# Patient Record
Sex: Female | Born: 1970 | Hispanic: Yes | Marital: Single | Smoking: Never smoker
Health system: Southern US, Community
[De-identification: ages and names within clinical notes are randomized; demographics above are authoritative.]

---

## 2008-02-24 ENCOUNTER — Ambulatory Visit: Payer: Self-pay | Admitting: Unknown Physician Specialty

## 2008-03-16 ENCOUNTER — Ambulatory Visit: Payer: Self-pay | Admitting: Unknown Physician Specialty

## 2010-05-06 HISTORY — PX: BREAST CYST ASPIRATION: SHX578

## 2011-09-11 ENCOUNTER — Ambulatory Visit: Payer: Self-pay | Admitting: General Practice

## 2013-12-30 ENCOUNTER — Ambulatory Visit: Payer: Self-pay | Admitting: Obstetrics & Gynecology

## 2014-01-11 ENCOUNTER — Ambulatory Visit: Payer: Self-pay | Admitting: Obstetrics & Gynecology

## 2015-02-10 ENCOUNTER — Ambulatory Visit: Payer: Self-pay | Admitting: Family

## 2015-02-10 ENCOUNTER — Encounter: Payer: Self-pay | Admitting: Family

## 2015-02-10 VITALS — BP 110/70 | HR 83 | Temp 98.0°F

## 2015-02-10 DIAGNOSIS — J302 Other seasonal allergic rhinitis: Secondary | ICD-10-CM

## 2015-02-10 DIAGNOSIS — Z299 Encounter for prophylactic measures, unspecified: Secondary | ICD-10-CM

## 2015-02-10 MED ORDER — FLUTICASONE PROPIONATE 50 MCG/ACT NA SUSP: 2.0000 | Freq: Every day | NASAL | Status: DC

## 2015-02-10 NOTE — Progress Notes (Signed)
   Subjective:    Patient ID: Heather Potts, female    DOB: 1970/10/22, 44 y.o.   MRN: [id number]  Sinus Problem This is a recurrent problem. The current episode started in the past 7 days. The problem is unchanged. There has been no fever. She is experiencing no pain. Associated symptoms include sinus pressure and sneezing. Pertinent negatives include no congestion, ear pain or shortness of breath. Past treatments include nothing.      Review of Systems  HENT: Positive for rhinorrhea, sinus pressure and sneezing. Negative for congestion and ear pain.   Eyes: Positive for itching.  Respiratory: Negative for shortness of breath.   All other systems reviewed and are negative.      Objective:   Physical Exam  Constitutional: She is oriented to person, place, and time. She appears well-developed and well-nourished. No distress.  HENT:  Head: Normocephalic.  Right Ear: Tympanic membrane is retracted.  Left Ear: Tympanic membrane is retracted.  Nose: Mucosal edema (allergic changes) present. Right sinus exhibits no maxillary sinus tenderness and no frontal sinus tenderness. Left sinus exhibits no maxillary sinus tenderness and no frontal sinus tenderness.  Mouth/Throat: Uvula is midline, oropharynx is clear and moist and mucous membranes are normal.  Eyes: Conjunctivae are normal.  Neck: Normal range of motion. Neck supple.  Cardiovascular: Normal rate and regular rhythm.   Pulmonary/Chest: Effort normal and breath sounds normal.  Lymphadenopathy:    She has no cervical adenopathy.  Neurological: She is alert and oriented to person, place, and time.  Nursing note and vitals reviewed.         Assessment & Plan:  Allergic rhinitis Seasonal allergies  Tip sheet reviewed. OTC products. Rx flonase .

## 2015-02-10 NOTE — Addendum Note (Signed)
Addended by: Catha Brow T on: 02/10/2015 02:25 PM   Modules accepted: Orders

## 2015-02-11 LAB — CMP12+LP+TP+TSH+6AC+CBC/D/PLT
A/G RATIO: 1.7 (ref 1.1–2.5)
ALBUMIN: 4.4 g/dL (ref 3.5–5.5)
ALT: 13 IU/L (ref 0–32)
AST: 16 IU/L (ref 0–40)
Alkaline Phosphatase: 96 IU/L (ref 39–117)
BASOS ABS: 0 10*3/uL (ref 0.0–0.2)
BUN/Creatinine Ratio: 12 (ref 9–23)
BUN: 11 mg/dL (ref 6–24)
Basos: 0 %
Bilirubin Total: 0.4 mg/dL (ref 0.0–1.2)
CHOLESTEROL TOTAL: 239 mg/dL — AB (ref 100–199)
CREATININE: 0.91 mg/dL (ref 0.57–1.00)
Calcium: 9.6 mg/dL (ref 8.7–10.2)
Chloride: 100 mmol/L (ref 97–108)
Chol/HDL Ratio: 4.7 ratio units — ABNORMAL HIGH (ref 0.0–4.4)
EOS (ABSOLUTE): 0.1 10*3/uL (ref 0.0–0.4)
Eos: 1 %
Estimated CHD Risk: 1.2 times avg. — ABNORMAL HIGH (ref 0.0–1.0)
Free Thyroxine Index: 2 (ref 1.2–4.9)
GFR, EST AFRICAN AMERICAN: 89 mL/min/{1.73_m2} (ref >59)
GFR, EST NON AFRICAN AMERICAN: 78 mL/min/{1.73_m2} (ref >59)
GGT: 15 IU/L (ref 0–60)
GLOBULIN, TOTAL: 2.6 g/dL (ref 1.5–4.5)
Glucose: 95 mg/dL (ref 65–99)
HDL: 51 mg/dL (ref >39)
Hematocrit: 42 % (ref 34.0–46.6)
Hemoglobin: 14.1 g/dL (ref 11.1–15.9)
IRON: 75 ug/dL (ref 27–159)
Immature Grans (Abs): 0 10*3/uL (ref 0.0–0.1)
Immature Granulocytes: 0 %
LDH: 135 IU/L (ref 119–226)
LDL Calculated: 157 mg/dL — ABNORMAL HIGH (ref 0–99)
Lymphocytes Absolute: 2.1 10*3/uL (ref 0.7–3.1)
Lymphs: 29 %
MCH: 29 pg (ref 26.6–33.0)
MCHC: 33.6 g/dL (ref 31.5–35.7)
MCV: 86 fL (ref 79–97)
MONOCYTES: 9 %
Monocytes Absolute: 0.6 10*3/uL (ref 0.1–0.9)
Neutrophils Absolute: 4.4 10*3/uL (ref 1.4–7.0)
Neutrophils: 61 %
PHOSPHORUS: 3.4 mg/dL (ref 2.5–4.5)
PLATELETS: 314 10*3/uL (ref 150–379)
Potassium: 4.1 mmol/L (ref 3.5–5.2)
RBC: 4.86 x10E6/uL (ref 3.77–5.28)
RDW: 13.6 % (ref 12.3–15.4)
Sodium: 139 mmol/L (ref 134–144)
T3 UPTAKE RATIO: 28 % (ref 24–39)
T4 TOTAL: 7.3 ug/dL (ref 4.5–12.0)
TOTAL PROTEIN: 7 g/dL (ref 6.0–8.5)
TRIGLYCERIDES: 154 mg/dL — AB (ref 0–149)
TSH: 1.04 u[IU]/mL (ref 0.450–4.500)
Uric Acid: 5.2 mg/dL (ref 2.5–7.1)
VLDL Cholesterol Cal: 31 mg/dL (ref 5–40)
WBC: 7.2 10*3/uL (ref 3.4–10.8)

## 2015-02-11 LAB — VITAMIN D 25 HYDROXY (VIT D DEFICIENCY, FRACTURES): VIT D 25 HYDROXY: 34 ng/mL (ref 30.0–100.0)

## 2015-02-11 LAB — RHEUMATOID FACTOR: RHEUMATOID FACTOR: 10.6 [IU]/mL (ref 0.0–13.9)

## 2015-02-15 NOTE — Progress Notes (Signed)
Spoke with patient about lab results and she expressed understanding.  Pt requested that I fax a copy of her results to her.

## 2015-02-21 ENCOUNTER — Inpatient Hospital Stay
Admission: RE | Admit: 2015-02-21 | Discharge: 2015-02-21 | Disposition: A | Discharge status: Home / Self Care | Payer: Self-pay | Source: Ambulatory Visit | Attending: *Deleted | Admitting: *Deleted

## 2015-02-21 ENCOUNTER — Other Ambulatory Visit: Payer: Self-pay | Admitting: *Deleted

## 2015-02-21 ENCOUNTER — Other Ambulatory Visit: Payer: Self-pay | Admitting: Obstetrics & Gynecology

## 2015-02-21 DIAGNOSIS — Z9289 Personal history of other medical treatment: Secondary | ICD-10-CM

## 2015-02-21 DIAGNOSIS — R928 Other abnormal and inconclusive findings on diagnostic imaging of breast: Secondary | ICD-10-CM

## 2015-03-15 ENCOUNTER — Other Ambulatory Visit: Payer: Self-pay

## 2015-04-24 ENCOUNTER — Ambulatory Visit
Admission: RE | Admit: 2015-04-24 | Discharge: 2015-04-24 | Disposition: A | Discharge status: Home / Self Care | Payer: PRIVATE HEALTH INSURANCE | Source: Ambulatory Visit | Attending: Obstetrics & Gynecology | Admitting: Obstetrics & Gynecology

## 2015-04-24 DIAGNOSIS — N6489 Other specified disorders of breast: Secondary | ICD-10-CM | POA: Diagnosis not present

## 2015-04-24 DIAGNOSIS — R928 Other abnormal and inconclusive findings on diagnostic imaging of breast: Secondary | ICD-10-CM

## 2015-08-02 ENCOUNTER — Ambulatory Visit: Payer: Self-pay | Admitting: Physician Assistant

## 2015-08-02 ENCOUNTER — Encounter: Payer: Self-pay | Admitting: Physician Assistant

## 2015-08-02 VITALS — BP 110/70 | HR 86 | Temp 98.2°F

## 2015-08-02 DIAGNOSIS — J069 Acute upper respiratory infection, unspecified: Secondary | ICD-10-CM

## 2015-08-02 DIAGNOSIS — R52 Pain, unspecified: Secondary | ICD-10-CM

## 2015-08-02 LAB — POCT INFLUENZA A/B
Influenza A, POC: NEGATIVE
Influenza B, POC: NEGATIVE

## 2015-08-02 LAB — POCT RAPID STREP A (OFFICE): Rapid Strep A Screen: NEGATIVE

## 2015-08-02 MED ORDER — CEFDINIR 300 MG PO CAPS: 300.0000 mg | ORAL_CAPSULE | Freq: Two times a day (BID) | ORAL | Status: DC

## 2015-08-02 NOTE — Progress Notes (Signed)
S: C/o runny nose and congestion with sore throat and dry cough for 2 days, +?fever, +chills, denies cp/sob, v/d; unsure of exposure to strep or flu  O: PE: vitals wnl, nad, pt feels warm to touch,  perrl eomi, normocephalic, tms dull, nasal mucosa red and swollen, throat injected, neck supple no lymph, lungs c t a, cv rrr, neuro intact, flu swab , q strep  A:  Acute flu like illness   P: drink fluids, continue regular meds , use otc meds of choice, return if not improving in 5 days, return earlier if worsening

## 2016-09-24 ENCOUNTER — Ambulatory Visit: Payer: Self-pay | Admitting: Obstetrics & Gynecology

## 2016-10-28 ENCOUNTER — Encounter: Payer: Self-pay | Admitting: Obstetrics & Gynecology

## 2016-10-28 ENCOUNTER — Ambulatory Visit (INDEPENDENT_AMBULATORY_CARE_PROVIDER_SITE_OTHER): Payer: Managed Care, Other (non HMO) | Admitting: Obstetrics & Gynecology

## 2016-10-28 VITALS — BP 120/80 | HR 82 | Ht 67.0 in | Wt 185.0 lb

## 2016-10-28 DIAGNOSIS — Z01419 Encounter for gynecological examination (general) (routine) without abnormal findings: Secondary | ICD-10-CM

## 2016-10-28 DIAGNOSIS — Z1231 Encounter for screening mammogram for malignant neoplasm of breast: Secondary | ICD-10-CM | POA: Diagnosis not present

## 2016-10-28 DIAGNOSIS — Z1239 Encounter for other screening for malignant neoplasm of breast: Secondary | ICD-10-CM

## 2016-10-28 DIAGNOSIS — Z13220 Encounter for screening for lipoid disorders: Secondary | ICD-10-CM | POA: Diagnosis not present

## 2016-10-28 DIAGNOSIS — Z0000 Encounter for general adult medical examination without abnormal findings: Secondary | ICD-10-CM | POA: Insufficient documentation

## 2016-10-28 NOTE — Patient Instructions (Addendum)
PAP every three years Mammogram every year    Ordered, call [phone] to schedule Labs - cholesterol   Endometrial Ablation Endometrial ablation is a procedure that destroys the thin inner layer of the lining of the uterus (endometrium). This procedure may be done:  To stop heavy periods.  To stop bleeding that is causing anemia.  To control irregular bleeding.  To treat bleeding caused by small tumors (fibroids) in the endometrium.  This procedure is often an alternative to major surgery, such as removal of the uterus and cervix (hysterectomy). As a result of this procedure:  You may not be able to have children. However, if you are premenopausal (you have not gone through menopause): ? You may still have a small chance of getting pregnant. ? You will need to use a reliable method of birth control after the procedure to prevent pregnancy.  You may stop having a menstrual period, or you may have only a small amount of bleeding during your period. Menstruation may return several years after the procedure.  Tell a health care provider about:  Any allergies you have.  All medicines you are taking, including vitamins, herbs, eye drops, creams, and over-the-counter medicines.  Any problems you or family members have had with the use of anesthetic medicines.  Any blood disorders you have.  Any surgeries you have had.  Any medical conditions you have. What are the risks? Generally, this is a safe procedure. However, problems may occur, including:  A hole (perforation) in the uterus or bowel.  Infection of the uterus, bladder, or vagina.  Bleeding.  Damage to other structures or organs.  An air bubble in the lung (air embolus).  Problems with pregnancy after the procedure.  Failure of the procedure.  Decreased ability to diagnose cancer in the endometrium.  What happens before the procedure?  You will have tests of your endometrium to make sure there are no  pre-cancerous cells or cancer cells present.  You may have an ultrasound of the uterus.  You may be given medicines to thin the endometrium.  Ask your health care provider about: ? Changing or stopping your regular medicines. This is especially important if you take diabetes medicines or blood thinners. ? Taking medicines such as aspirin and ibuprofen. These medicines can thin your blood. Do not take these medicines before your procedure if your doctor tells you not to.  Plan to have someone take you home from the hospital or clinic. What happens during the procedure?  You will lie on an exam table with your feet and legs supported as in a pelvic exam.  To lower your risk of infection: ? Your health care team will wash or sanitize their hands and put on germ-free (sterile) gloves. ? Your genital area will be washed with soap.  An IV tube will be inserted into one of your veins.  You will be given a medicine to help you relax (sedative).  A surgical instrument with a light and camera (resectoscope) will be inserted into your vagina and moved into your uterus. This allows your surgeon to see inside your uterus.  Endometrial tissue will be removed using one of the following methods: ? Radiofrequency. This method uses a radiofrequency-alternating electric current to remove the endometrium. ? Cryotherapy. This method uses extreme cold to freeze the endometrium. ? Heated-free liquid. This method uses a heated saltwater (saline) solution to remove the endometrium. ? Microwave. This method uses high-energy microwaves to heat up the endometrium and remove it. ?  Thermal balloon. This method involves inserting a catheter with a balloon tip into the uterus. The balloon tip is filled with heated fluid to remove the endometrium. The procedure may vary among health care providers and hospitals. What happens after the procedure?  Your blood pressure, heart rate, breathing rate, and blood oxygen  level will be monitored until the medicines you were given have worn off.  As tissue healing occurs, you may notice vaginal bleeding for 4-6 weeks after the procedure. You may also experience: ? Cramps. ? Thin, watery vaginal discharge that is light pink or brown in color. ? A need to urinate more frequently than usual. ? Nausea.  Do not drive for 24 hours if you were given a sedative.  Do not have sex or insert anything into your vagina until your health care provider approves. Summary  Endometrial ablation is done to treat the many causes of heavy menstrual bleeding.  The procedure may be done only after medications have been tried to control the bleeding.  Plan to have someone take you home from the hospital or clinic. This information is not intended to replace advice given to you by your health care provider. Make sure you discuss any questions you have with your health care provider. Document Released: 03/01/2004 Document Revised: 05/09/2016 Document Reviewed: 05/09/2016 Elsevier Interactive Patient Education  2017 [employer]

## 2016-10-28 NOTE — Progress Notes (Signed)
HPI:      Heather Potts is a 46 y.o. [id number] who LMP was Patient's last menstrual period was 10/05/2016., she presents today for her annual examination. The patient has no complaints today. The patient is sexually active. Her last pap: approximate date 2016 and was normal and last mammogram: approximate date 2016 and was normal. The patient does perform self breast exams.  There is no notable family history of breast or ovarian cancer in her family.  The patient has regular exercise: yes.  The patient denies current symptoms of depression.    GYN History: Contraception: tubal ligation  PMHx: History reviewed. No pertinent past medical history. Past Surgical History:  Procedure Laterality Date  . BREAST CYST ASPIRATION Right 2012   Family History  Problem Relation Age of Onset  . Hypertension Mother   . Hypertension Father    Social History  Substance Use Topics  . Smoking status: Never Smoker  . Smokeless tobacco: Never Used  . Alcohol use No    Current Outpatient Prescriptions:  .  cefdinir (OMNICEF) 300 MG capsule, Take 1 capsule (300 mg total) by mouth 2 (two) times daily., Disp: 20 capsule, Rfl: 0 .  fluticasone (FLONASE) 50 MCG/ACT nasal spray, Place 2 sprays into both nostrils daily. For allergies, Disp: 16 g, Rfl: 5 Allergies: Patient has no known allergies.  Review of Systems  Constitutional: Negative for chills, fever and malaise/fatigue.  HENT: Negative for congestion, sinus pain and sore throat.   Eyes: Negative for blurred vision and pain.  Respiratory: Negative for cough and wheezing.   Cardiovascular: Negative for chest pain and leg swelling.  Gastrointestinal: Negative for abdominal pain, constipation, diarrhea, heartburn, nausea and vomiting.  Genitourinary: Negative for dysuria, frequency, hematuria and urgency.  Musculoskeletal: Negative for back pain, joint pain, myalgias and neck pain.  Skin: Negative for itching and rash.    Neurological: Negative for dizziness, tremors and weakness.  Endo/Heme/Allergies: Does not bruise/bleed easily.  Psychiatric/Behavioral: Negative for depression. The patient is not nervous/anxious and does not have insomnia.     Objective: BP 120/80   Pulse 82   Ht 5\' 7"  (1.702 m)   Wt 185 lb (83.9 kg)   LMP 10/05/2016   BMI 28.98 kg/m   Filed Weights   10/28/16 0808  Weight: 185 lb (83.9 kg)   Body mass index is 28.98 kg/m. Physical Exam  Constitutional: She is oriented to person, place, and time. She appears well-developed and well-nourished. No distress.  Genitourinary: Rectum normal, vagina normal and uterus normal. Pelvic exam was performed with patient supine. There is no rash or lesion on the right labia. There is no rash or lesion on the left labia. Vagina exhibits no lesion. No bleeding in the vagina. Right adnexum does not display mass and does not display tenderness. Left adnexum does not display mass and does not display tenderness. Cervix does not exhibit motion tenderness, lesion, friability or polyp.   Uterus is mobile and midaxial. Uterus is not enlarged or exhibiting a mass.  HENT:  Head: Normocephalic and atraumatic. Head is without laceration.  Right Ear: Hearing normal.  Left Ear: Hearing normal.  Nose: No epistaxis.  No foreign bodies.  Mouth/Throat: Uvula is midline, oropharynx is clear and moist and mucous membranes are normal.  Eyes: Pupils are equal, round, and reactive to light.  Neck: Normal range of motion. Neck supple. No thyromegaly present.  Cardiovascular: Normal rate and regular rhythm.  Exam reveals no gallop and no friction rub.  No murmur heard. Pulmonary/Chest: Effort normal and breath sounds normal. No respiratory distress. She has no wheezes. Right breast exhibits no mass, no skin change and no tenderness. Left breast exhibits no mass, no skin change and no tenderness.  Abdominal: Soft. Bowel sounds are normal. She exhibits no distension.  There is no tenderness. There is no rebound.  Musculoskeletal: Normal range of motion.  Neurological: She is alert and oriented to person, place, and time. No cranial nerve deficit.  Skin: Skin is warm and dry.  Psychiatric: She has a normal mood and affect. Judgment normal.  Vitals reviewed.   Assessment:  ANNUAL EXAM 1. Annual physical exam   2. Screening for breast cancer   3. Screening for cholesterol level      Screening Plan:            1.  Cervical Screening-   Pap smear schedule reviewed with patient  2. Breast screening- Exam annually and mammogram>40 planned   3. Colonoscopy every 10 years, Hemoccult testing - after age 56  4. Labs managed by PCP  Recent chol 219 (last year 239)  5. Counseling for contraception: bilateral tubal ligation  Other:  1. Annual physical exam  2. Screening for breast cancer- MMG  3. Screening for cholesterol level Lab through PCP     F/U  Return in about 1 year (around 10/28/2017) for Annual.  Annamarie Major, MD, [facility], [facility] 10/28/2016  8:26 AM

## 2017-01-02 ENCOUNTER — Other Ambulatory Visit: Payer: Self-pay | Admitting: Obstetrics & Gynecology

## 2017-01-02 DIAGNOSIS — N6489 Other specified disorders of breast: Secondary | ICD-10-CM

## 2017-02-05 ENCOUNTER — Ambulatory Visit
Admission: RE | Admit: 2017-02-05 | Discharge: 2017-02-05 | Disposition: A | Discharge status: Home / Self Care | Payer: Managed Care, Other (non HMO) | Source: Ambulatory Visit | Attending: Obstetrics & Gynecology | Admitting: Obstetrics & Gynecology

## 2017-02-05 DIAGNOSIS — N6489 Other specified disorders of breast: Secondary | ICD-10-CM

## 2017-02-06 ENCOUNTER — Telehealth: Payer: Self-pay

## 2017-02-06 NOTE — Telephone Encounter (Signed)
Pt calling for results from her annual exam 11/2016. [phone], [phone]

## 2017-02-10 NOTE — Telephone Encounter (Signed)
I told pt it looks like there was NO pap done this year, I see no results, pt aware I will check with you just to make sure, please advise

## 2017-02-10 NOTE — Telephone Encounter (Signed)
PAP 2016, up to date, due every three years, due 2019 MMG every year

## 2017-02-10 NOTE — Telephone Encounter (Signed)
Left message for pt to call back  °

## 2017-03-03 ENCOUNTER — Encounter: Payer: Self-pay | Admitting: Obstetrics & Gynecology

## 2017-03-05 ENCOUNTER — Other Ambulatory Visit: Payer: Self-pay

## 2017-03-05 DIAGNOSIS — Z299 Encounter for prophylactic measures, unspecified: Secondary | ICD-10-CM

## 2017-03-05 NOTE — Progress Notes (Signed)
Patient came in to have blood drawn for testing per Dr. Wandra Mannanscar Cornelio Flores's orders.

## 2017-03-06 LAB — CMP12+LP+TP+TSH+6AC+CBC/D/PLT
ALK PHOS: 108 IU/L (ref 39–117)
ALT: 14 IU/L (ref 0–32)
AST: 16 IU/L (ref 0–40)
Albumin/Globulin Ratio: 1.5 (ref 1.2–2.2)
Albumin: 4.3 g/dL (ref 3.5–5.5)
BASOS ABS: 0 10*3/uL (ref 0.0–0.2)
BILIRUBIN TOTAL: 0.5 mg/dL (ref 0.0–1.2)
BUN / CREAT RATIO: 12 (ref 9–23)
BUN: 12 mg/dL (ref 6–24)
Basos: 0 %
CALCIUM: 9.5 mg/dL (ref 8.7–10.2)
CHLORIDE: 101 mmol/L (ref 96–106)
CHOL/HDL RATIO: 5 ratio — AB (ref 0.0–4.4)
CREATININE: 1.03 mg/dL — AB (ref 0.57–1.00)
Cholesterol, Total: 227 mg/dL — ABNORMAL HIGH (ref 100–199)
EOS (ABSOLUTE): 0.1 10*3/uL (ref 0.0–0.4)
ESTIMATED CHD RISK: 1.3 times avg. — AB (ref 0.0–1.0)
Eos: 1 %
Free Thyroxine Index: 1.9 (ref 1.2–4.9)
GFR calc Af Amer: 76 mL/min/{1.73_m2} (ref >59)
GFR, EST NON AFRICAN AMERICAN: 66 mL/min/{1.73_m2} (ref >59)
GGT: 15 IU/L (ref 0–60)
GLUCOSE: 93 mg/dL (ref 65–99)
Globulin, Total: 2.9 g/dL (ref 1.5–4.5)
HDL: 45 mg/dL (ref >39)
HEMATOCRIT: 40 % (ref 34.0–46.6)
HEMOGLOBIN: 13.2 g/dL (ref 11.1–15.9)
Immature Grans (Abs): 0 10*3/uL (ref 0.0–0.1)
Immature Granulocytes: 0 %
Iron: 77 ug/dL (ref 27–159)
LDH: 152 IU/L (ref 119–226)
LDL CALC: 155 mg/dL — AB (ref 0–99)
LYMPHS ABS: 2.2 10*3/uL (ref 0.7–3.1)
Lymphs: 30 %
MCH: 28.8 pg (ref 26.6–33.0)
MCHC: 33 g/dL (ref 31.5–35.7)
MCV: 87 fL (ref 79–97)
Monocytes Absolute: 0.5 10*3/uL (ref 0.1–0.9)
Monocytes: 6 %
Neutrophils Absolute: 4.6 10*3/uL (ref 1.4–7.0)
Neutrophils: 63 %
PLATELETS: 316 10*3/uL (ref 150–379)
Phosphorus: 3.5 mg/dL (ref 2.5–4.5)
Potassium: 4.1 mmol/L (ref 3.5–5.2)
RBC: 4.58 x10E6/uL (ref 3.77–5.28)
RDW: 13.8 % (ref 12.3–15.4)
Sodium: 141 mmol/L (ref 134–144)
T3 Uptake Ratio: 26 % (ref 24–39)
T4, Total: 7.2 ug/dL (ref 4.5–12.0)
TSH: 2.1 u[IU]/mL (ref 0.450–4.500)
Total Protein: 7.2 g/dL (ref 6.0–8.5)
Triglycerides: 136 mg/dL (ref 0–149)
URIC ACID: 5.3 mg/dL (ref 2.5–7.1)
VLDL CHOLESTEROL CAL: 27 mg/dL (ref 5–40)
WBC: 7.4 10*3/uL (ref 3.4–10.8)

## 2017-03-06 LAB — SEDIMENTATION RATE: SED RATE: 50 mm/h — AB (ref 0–32)

## 2017-03-06 LAB — VITAMIN D 25 HYDROXY (VIT D DEFICIENCY, FRACTURES): VIT D 25 HYDROXY: 38 ng/mL (ref 30.0–100.0)

## 2017-03-06 LAB — RHEUMATOID FACTOR: RHEUMATOID FACTOR: 10.2 [IU]/mL (ref 0.0–13.9)

## 2017-04-02 ENCOUNTER — Ambulatory Visit: Payer: Self-pay | Admitting: Emergency Medicine

## 2017-04-02 VITALS — BP 110/70 | HR 83 | Temp 98.5°F | Resp 16

## 2017-04-02 DIAGNOSIS — J069 Acute upper respiratory infection, unspecified: Secondary | ICD-10-CM

## 2017-04-02 LAB — POCT RAPID STREP A (OFFICE): RAPID STREP A SCREEN: NEGATIVE

## 2017-04-02 NOTE — Progress Notes (Signed)
S: is here complaining of drainage, so and ears itching. This is been going on for several days. She denies any fever.  She has not been taking any OTC medication. O:  EACs are clear. TMs are dull with poor light reflex. No injection or erythema present. Posterior drainage noted. No exudate or erythema notedon oral exam. Neck is supple without adenopathy. Lungs are clear bilaterally. Heart regular rate and rhythm with murmur. A:  URI, viral  P:  OTC decongestant

## 2017-04-02 NOTE — Addendum Note (Signed)
Addended by: Catha BrowEACON, MONIQUE T on: 04/02/2017 02:13 PM   Modules accepted: Orders

## 2018-01-08 ENCOUNTER — Other Ambulatory Visit: Payer: Self-pay | Admitting: Family Medicine

## 2018-01-08 DIAGNOSIS — Z1231 Encounter for screening mammogram for malignant neoplasm of breast: Secondary | ICD-10-CM

## 2018-02-06 ENCOUNTER — Ambulatory Visit
Admission: RE | Admit: 2018-02-06 | Discharge: 2018-02-06 | Disposition: A | Discharge status: Home / Self Care | Payer: Managed Care, Other (non HMO) | Source: Ambulatory Visit | Attending: Family Medicine | Admitting: Family Medicine

## 2018-02-06 DIAGNOSIS — Z1231 Encounter for screening mammogram for malignant neoplasm of breast: Secondary | ICD-10-CM

## 2018-06-12 ENCOUNTER — Other Ambulatory Visit: Payer: Self-pay | Admitting: Podiatry

## 2018-06-12 ENCOUNTER — Encounter: Payer: Self-pay | Admitting: Podiatry

## 2018-06-12 ENCOUNTER — Ambulatory Visit (INDEPENDENT_AMBULATORY_CARE_PROVIDER_SITE_OTHER): Payer: Managed Care, Other (non HMO) | Admitting: Podiatry

## 2018-06-12 ENCOUNTER — Ambulatory Visit (INDEPENDENT_AMBULATORY_CARE_PROVIDER_SITE_OTHER): Payer: Managed Care, Other (non HMO)

## 2018-06-12 DIAGNOSIS — M21612 Bunion of left foot: Secondary | ICD-10-CM | POA: Diagnosis not present

## 2018-06-12 DIAGNOSIS — M7661 Achilles tendinitis, right leg: Secondary | ICD-10-CM | POA: Diagnosis not present

## 2018-06-12 DIAGNOSIS — M7662 Achilles tendinitis, left leg: Principal | ICD-10-CM

## 2018-06-12 DIAGNOSIS — M21611 Bunion of right foot: Secondary | ICD-10-CM | POA: Diagnosis not present

## 2018-06-12 DIAGNOSIS — M7730 Calcaneal spur, unspecified foot: Secondary | ICD-10-CM

## 2018-06-12 DIAGNOSIS — M21619 Bunion of unspecified foot: Secondary | ICD-10-CM

## 2018-06-12 MED ORDER — CYCLOBENZAPRINE HCL 5 MG PO TABS: 5.0000 mg | ORAL_TABLET | Freq: Three times a day (TID) | ORAL | 1 refills | Status: DC | PRN

## 2018-06-12 MED ORDER — METHYLPREDNISOLONE 4 MG PO TBPK: ORAL_TABLET | ORAL | 0 refills | Status: DC

## 2018-06-14 NOTE — Progress Notes (Signed)
   HPI: 48 year old female presenting today as a new patient with a chief complaint of intermittent burning, sharp pain of the heels bilaterally that began 2-3 months ago. She also notes painful bunions bilaterally that have been getting progressively worse over the past few years. Walking and wearing shoes increases the pain. She has been using deep blue essential oils and stretching for treatment. Patient is here for further evaluation and treatment.   No past medical history on file.    Physical Exam: General: The patient is alert and oriented x3 in no acute distress.  Dermatology: Skin is warm, dry and supple bilateral lower extremities. Negative for open lesions or macerations.  Vascular: Palpable pedal pulses bilaterally. No edema or erythema noted. Capillary refill within normal limits.  Neurological: Epicritic and protective threshold grossly intact bilaterally.   Musculoskeletal Exam: Pain on palpation noted to the posterior tubercle of the bilateral calcaneus at the insertion of the Achilles tendon consistent with retrocalcaneal bursitis. Range of motion within normal limits. Muscle strength 5/5 in all muscle groups bilateral lower extremities. Clinical evidence of bunion deformity noted to the respective foot. There is moderate pain on palpation range of motion of the first MPJ. Lateral deviation of the hallux noted consistent with hallux abductovalgus.   Radiographic Exam: Increased intermetatarsal angle greater than 15 with a hallux abductus angle greater than 30 noted on AP view. Moderate degenerative changes noted within the first MPJ. Posterior heel spurs noted bilaterally.     Assessment: 1.Nocturnal foot and hand cramps 2. Posterior heel spurs bilateral  3. Insertional Achilles tendinitis bilateral  4. Retrocalcaneal bursitis 5. HAV w/ bunion deformity bilateral    Plan of Care:  1. Patient was evaluated. Radiographs were reviewed today. 2. Prescription for Medrol  Dose Pak provided to patient. 3. Prescription for Flexeril 5 mg provided to patient.  4. Silicone heel sleeves dispensed bilaterally.  5. Recommended good shoe gear or shoes with a 1" heel.  6. Patient does not want to take may oral medications.  7. Return to clinic in 4 weeks.   From [place]. She is from the [place].    Felecia Shelling, DPM [facility]  Dr. Felecia Shelling, DPM    [address]                                        [place], [place] [address]                Office [phone]  Fax [phone]

## 2018-07-10 ENCOUNTER — Ambulatory Visit: Payer: Managed Care, Other (non HMO) | Admitting: Podiatry

## 2018-07-14 ENCOUNTER — Ambulatory Visit: Payer: Managed Care, Other (non HMO) | Admitting: Podiatry

## 2019-01-07 ENCOUNTER — Other Ambulatory Visit: Payer: Self-pay | Admitting: Family Medicine

## 2019-01-07 DIAGNOSIS — Z1231 Encounter for screening mammogram for malignant neoplasm of breast: Secondary | ICD-10-CM

## 2019-01-21 ENCOUNTER — Other Ambulatory Visit (HOSPITAL_COMMUNITY)
Admission: RE | Admit: 2019-01-21 | Discharge: 2019-01-21 | Disposition: A | Discharge status: Home / Self Care | Payer: Managed Care, Other (non HMO) | Source: Ambulatory Visit | Attending: Obstetrics & Gynecology | Admitting: Obstetrics & Gynecology

## 2019-01-21 ENCOUNTER — Ambulatory Visit (INDEPENDENT_AMBULATORY_CARE_PROVIDER_SITE_OTHER): Payer: Managed Care, Other (non HMO) | Admitting: Obstetrics & Gynecology

## 2019-01-21 ENCOUNTER — Other Ambulatory Visit: Payer: Self-pay

## 2019-01-21 ENCOUNTER — Encounter: Payer: Self-pay | Admitting: Obstetrics & Gynecology

## 2019-01-21 VITALS — BP 130/80 | Ht 67.0 in | Wt 193.0 lb

## 2019-01-21 DIAGNOSIS — Z124 Encounter for screening for malignant neoplasm of cervix: Secondary | ICD-10-CM

## 2019-01-21 DIAGNOSIS — Z01419 Encounter for gynecological examination (general) (routine) without abnormal findings: Secondary | ICD-10-CM

## 2019-01-21 DIAGNOSIS — Z1239 Encounter for other screening for malignant neoplasm of breast: Secondary | ICD-10-CM

## 2019-01-21 NOTE — Patient Instructions (Signed)
PAP every three years Mammogram every year Labs yearly (with PCP) 

## 2019-01-21 NOTE — Progress Notes (Signed)
HPI:      Heather Potts is a 48 y.o. [id number] who LMP was Patient's last menstrual period was 01/18/2019., she presents today for her annual examination. The patient has no complaints today. The patient is sexually active. Her last pap: was normal and last mammogram: approximate date 2019 and was normal. The patient does perform self breast exams.  There is no notable family history of breast or ovarian cancer in her family.  The patient has regular exercise: yes.  The patient denies current symptoms of depression.  Cycles reg, somewhat heavy 2 days.  Bleeding today.  GYN History: Contraception: tubal ligation  PMHx: History reviewed. No pertinent past medical history. Past Surgical History:  Procedure Laterality Date  . BREAST CYST ASPIRATION Right 2012   Family History  Problem Relation Age of Onset  . Hypertension Mother   . Hypertension Father   . Breast cancer Neg Hx    Social History   Tobacco Use  . Smoking status: Never Smoker  . Smokeless tobacco: Never Used  Substance Use Topics  . Alcohol use: No    Alcohol/week: 0.0 standard drinks  . Drug use: No    Current Outpatient Medications:  .  cyclobenzaprine (FLEXERIL) 5 MG tablet, Take 1 tablet (5 mg total) by mouth 3 (three) times daily as needed for muscle spasms. (Patient not taking: Reported on 01/21/2019), Disp: 30 tablet, Rfl: 1 .  methylPREDNISolone (MEDROL DOSEPAK) 4 MG TBPK tablet, 6 day dose pack - take as directed (Patient not taking: Reported on 01/21/2019), Disp: 21 tablet, Rfl: 0 Allergies: Patient has no known allergies.  Review of Systems  Constitutional: Negative for chills, fever and malaise/fatigue.  HENT: Negative for congestion, sinus pain and sore throat.   Eyes: Negative for blurred vision and pain.  Respiratory: Negative for cough and wheezing.   Cardiovascular: Negative for chest pain and leg swelling.  Gastrointestinal: Negative for abdominal pain, constipation, diarrhea,  heartburn, nausea and vomiting.  Genitourinary: Negative for dysuria, frequency, hematuria and urgency.  Musculoskeletal: Negative for back pain, joint pain, myalgias and neck pain.  Skin: Negative for itching and rash.  Neurological: Negative for dizziness, tremors and weakness.  Endo/Heme/Allergies: Does not bruise/bleed easily.  Psychiatric/Behavioral: Negative for depression. The patient is not nervous/anxious and does not have insomnia.     Objective: BP 130/80   Ht 5\' 7"  (1.702 m)   Wt 193 lb (87.5 kg)   LMP 01/18/2019   BMI 30.23 kg/m   Filed Weights   01/21/19 1527  Weight: 193 lb (87.5 kg)   Body mass index is 30.23 kg/m. Physical Exam Constitutional:      General: She is not in acute distress.    Appearance: She is well-developed.  Genitourinary:     Pelvic exam was performed with patient supine.     Vagina, uterus and rectum normal.     No lesions in the vagina.     No vaginal bleeding.     No cervical motion tenderness, friability, lesion or polyp.     Uterus is mobile.     Uterus is not enlarged.     No uterine mass detected.    Uterus is midaxial.     No right or left adnexal mass present.     Right adnexa not tender.     Left adnexa not tender.  HENT:     Head: Normocephalic and atraumatic. No laceration.     Right Ear: Hearing normal.     Left  Ear: Hearing normal.     Mouth/Throat:     Pharynx: Uvula midline.  Eyes:     Pupils: Pupils are equal, round, and reactive to light.  Neck:     Musculoskeletal: Normal range of motion and neck supple.     Thyroid: No thyromegaly.  Cardiovascular:     Rate and Rhythm: Normal rate and regular rhythm.     Heart sounds: No murmur. No friction rub. No gallop.   Pulmonary:     Effort: Pulmonary effort is normal. No respiratory distress.     Breath sounds: Normal breath sounds. No wheezing.  Chest:     Breasts:        Right: No mass, skin change or tenderness.        Left: No mass, skin change or tenderness.   Abdominal:     General: Bowel sounds are normal. There is no distension.     Palpations: Abdomen is soft.     Tenderness: There is no abdominal tenderness. There is no rebound.  Musculoskeletal: Normal range of motion.  Neurological:     Mental Status: She is alert and oriented to person, place, and time.     Cranial Nerves: No cranial nerve deficit.  Skin:    General: Skin is warm and dry.  Psychiatric:        Judgment: Judgment normal.  Vitals signs reviewed.     Assessment:  ANNUAL EXAM 1. Women's annual routine gynecological examination   2. Screening for cervical cancer   3. Screening for breast cancer     Screening Plan:            1.  Cervical Screening-  Pap smear done today  2. Breast screening- Exam annually and mammogram>40 planned   3. Labs managed by PCP  May opt for fasting labs here later  4. Counseling for contraception: bilateral tubal ligation    F/U  Return in about 1 year (around 01/21/2020) for Annual.  [name] , MD, [facility] Frederick[facility]AC[facility], [facility] 01/21/2019  4:00 PM

## 2019-01-27 LAB — CYTOLOGY - PAP
Diagnosis: REACTIVE
High risk HPV: NEGATIVE
Molecular Disclaimer: 56
Molecular Disclaimer: DETECTED
Molecular Disclaimer: NORMAL

## 2019-02-12 ENCOUNTER — Ambulatory Visit
Admission: RE | Admit: 2019-02-12 | Discharge: 2019-02-12 | Disposition: A | Discharge status: Home / Self Care | Payer: Managed Care, Other (non HMO) | Source: Ambulatory Visit | Attending: Family Medicine | Admitting: Family Medicine

## 2019-02-12 DIAGNOSIS — Z1231 Encounter for screening mammogram for malignant neoplasm of breast: Secondary | ICD-10-CM | POA: Insufficient documentation

## 2020-01-12 ENCOUNTER — Other Ambulatory Visit: Payer: Self-pay | Admitting: Obstetrics & Gynecology

## 2020-01-12 DIAGNOSIS — Z1231 Encounter for screening mammogram for malignant neoplasm of breast: Secondary | ICD-10-CM

## 2020-01-28 ENCOUNTER — Other Ambulatory Visit: Payer: Self-pay

## 2020-01-28 ENCOUNTER — Ambulatory Visit (INDEPENDENT_AMBULATORY_CARE_PROVIDER_SITE_OTHER): Payer: Managed Care, Other (non HMO) | Admitting: Obstetrics & Gynecology

## 2020-01-28 ENCOUNTER — Encounter: Payer: Self-pay | Admitting: Obstetrics & Gynecology

## 2020-01-28 VITALS — BP 122/80 | Ht 67.0 in | Wt 190.0 lb

## 2020-01-28 DIAGNOSIS — Z1211 Encounter for screening for malignant neoplasm of colon: Secondary | ICD-10-CM | POA: Diagnosis not present

## 2020-01-28 DIAGNOSIS — Z1329 Encounter for screening for other suspected endocrine disorder: Secondary | ICD-10-CM | POA: Diagnosis not present

## 2020-01-28 DIAGNOSIS — Z13220 Encounter for screening for lipoid disorders: Secondary | ICD-10-CM

## 2020-01-28 DIAGNOSIS — Z01419 Encounter for gynecological examination (general) (routine) without abnormal findings: Secondary | ICD-10-CM | POA: Diagnosis not present

## 2020-01-28 DIAGNOSIS — Z131 Encounter for screening for diabetes mellitus: Secondary | ICD-10-CM

## 2020-01-28 DIAGNOSIS — Z1231 Encounter for screening mammogram for malignant neoplasm of breast: Secondary | ICD-10-CM | POA: Diagnosis not present

## 2020-01-28 DIAGNOSIS — Z1321 Encounter for screening for nutritional disorder: Secondary | ICD-10-CM

## 2020-01-28 NOTE — Progress Notes (Signed)
HPI:      [name] is a 49 y.o. [id number] who LMP was Patient's last menstrual period was 01/04/2020., she presents today for her annual examination. The patient has no complaints today. The patient is sexually active. Her last pap: approximate date 2020 and was normal and last mammogram: approximate date 2020 and was normal. The patient does perform self breast exams.  There is no notable family history of breast or ovarian cancer in her family.  The patient has regular exercise: yes.  The patient denies current symptoms of depression.    GYN History: Contraception: vasectomy  Periods still regular  PMHx: History reviewed. No pertinent past medical history. Past Surgical History:  Procedure Laterality Date  . BREAST CYST ASPIRATION Right 2012   Family History  Problem Relation Age of Onset  . Hypertension Mother   . Hypertension Father   . Breast cancer Neg Hx    Social History   Tobacco Use  . Smoking status: Never Smoker  . Smokeless tobacco: Never Used  Substance Use Topics  . Alcohol use: No    Alcohol/week: 0.0 standard drinks  . Drug use: No    Current Outpatient Medications:  .  cyclobenzaprine (FLEXERIL) 5 MG tablet, Take 1 tablet (5 mg total) by mouth 3 (three) times daily as needed for muscle spasms. (Patient not taking: Reported on 01/21/2019), Disp: 30 tablet, Rfl: 1 .  methylPREDNISolone (MEDROL DOSEPAK) 4 MG TBPK tablet, 6 day dose pack - take as directed (Patient not taking: Reported on 01/21/2019), Disp: 21 tablet, Rfl: 0 Allergies: Patient has no known allergies.  Review of Systems  Constitutional: Negative for chills, fever and malaise/fatigue.  HENT: Negative for congestion, sinus pain and sore throat.   Eyes: Negative for blurred vision and pain.  Respiratory: Negative for cough and wheezing.   Cardiovascular: Negative for chest pain and leg swelling.  Gastrointestinal: Negative for abdominal pain, constipation, diarrhea, heartburn,  nausea and vomiting.  Genitourinary: Negative for dysuria, frequency, hematuria and urgency.  Musculoskeletal: Negative for back pain, joint pain, myalgias and neck pain.  Skin: Negative for itching and rash.  Neurological: Negative for dizziness, tremors and weakness.  Endo/Heme/Allergies: Does not bruise/bleed easily.  Psychiatric/Behavioral: Negative for depression. The patient is not nervous/anxious and does not have insomnia.     Objective: BP 122/80   Ht 5\' 7"  (1.702 m)   Wt 190 lb (86.2 kg)   LMP 01/04/2020   BMI 29.76 kg/m   Filed Weights   01/28/20 0804  Weight: 190 lb (86.2 kg)   Body mass index is 29.76 kg/m. Physical Exam Constitutional:      General: She is not in acute distress.    Appearance: She is well-developed.  Genitourinary:     Pelvic exam was performed with patient supine.     Vagina, uterus and rectum normal.     No lesions in the vagina.     No vaginal bleeding.     No cervical motion tenderness, friability, lesion or polyp.     Uterus is mobile.     Uterus is not enlarged.     No uterine mass detected.    Uterus is midaxial.     No right or left adnexal mass present.     Right adnexa not tender.     Left adnexa not tender.  HENT:     Head: Normocephalic and atraumatic. No laceration.     Right Ear: Hearing normal.     Left Ear:  Hearing normal.     Mouth/Throat:     Pharynx: Uvula midline.  Eyes:     Pupils: Pupils are equal, round, and reactive to light.  Neck:     Thyroid: No thyromegaly.  Cardiovascular:     Rate and Rhythm: Normal rate and regular rhythm.     Heart sounds: No murmur heard.  No friction rub. No gallop.   Pulmonary:     Effort: Pulmonary effort is normal. No respiratory distress.     Breath sounds: Normal breath sounds. No wheezing.  Chest:     Breasts:        Right: No mass, skin change or tenderness.        Left: No mass, skin change or tenderness.  Abdominal:     General: Bowel sounds are normal. There is no  distension.     Palpations: Abdomen is soft.     Tenderness: There is no abdominal tenderness. There is no rebound.  Musculoskeletal:        General: Normal range of motion.     Cervical back: Normal range of motion and neck supple.  Neurological:     Mental Status: She is alert and oriented to person, place, and time.     Cranial Nerves: No cranial nerve deficit.  Skin:    General: Skin is warm and dry.  Psychiatric:        Judgment: Judgment normal.  Vitals reviewed.     Assessment:  ANNUAL EXAM 1. Women's annual routine gynecological examination   2. Encounter for screening mammogram for malignant neoplasm of breast   3. Screen for colon cancer   4. Screening for thyroid disorder   5. Screening for diabetes mellitus   6. Screening for cholesterol level   7. Encounter for vitamin deficiency screening              1.  Cervical Screening-  Pap smear schedule reviewed with patient  2. Breast screening- Exam annually and mammogram>40 planned   3. Colonoscopy every 10 years, Hemoccult testing - after age 45 - plan soon  4. Labs Ordered today  5. Counseling for contraception: Vasectomy    F/U  Return in about 1 year (around 01/27/2021) for Annual.  Annamarie Major, MD, [facility], [facility] 01/28/2020  8:32 AM

## 2020-01-28 NOTE — Patient Instructions (Signed)
PAP every three years Mammogram every year    Call [phone] to schedule at [place] Colonoscopy every 10 years Labs yearly (with PCP)  Thank you for choosing [facility]. As part of our ongoing efforts to improve patient experience, we would appreciate your feedback. Please fill out the short survey that you will receive by mail or MyChart. Your opinion is important to us! - Dr. Burke Terry   

## 2020-01-29 LAB — GLUCOSE, FASTING: Glucose, Plasma: 94 mg/dL (ref 65–99)

## 2020-01-29 LAB — VITAMIN D 25 HYDROXY (VIT D DEFICIENCY, FRACTURES): Vit D, 25-Hydroxy: 50.5 ng/mL (ref 30.0–100.0)

## 2020-01-29 LAB — LIPID PANEL
Chol/HDL Ratio: 5.1 ratio — ABNORMAL HIGH (ref 0.0–4.4)
Cholesterol, Total: 253 mg/dL — ABNORMAL HIGH (ref 100–199)
HDL: 50 mg/dL (ref >39)
LDL Chol Calc (NIH): 169 mg/dL — ABNORMAL HIGH (ref 0–99)
Triglycerides: 183 mg/dL — ABNORMAL HIGH (ref 0–149)
VLDL Cholesterol Cal: 34 mg/dL (ref 5–40)

## 2020-01-29 LAB — TSH: TSH: 1.63 u[IU]/mL (ref 0.450–4.500)

## 2020-02-03 ENCOUNTER — Telehealth (INDEPENDENT_AMBULATORY_CARE_PROVIDER_SITE_OTHER): Payer: Self-pay | Admitting: Gastroenterology

## 2020-02-03 ENCOUNTER — Other Ambulatory Visit: Payer: Self-pay

## 2020-02-03 DIAGNOSIS — Z1211 Encounter for screening for malignant neoplasm of colon: Secondary | ICD-10-CM

## 2020-02-03 NOTE — Progress Notes (Signed)
Gastroenterology Pre-Procedure Review  Request Date: Not Scheduled- she plans to check insurance due to age and check work schedule before scheduling. Requesting Physician: Tobi Bastos  PATIENT REVIEW QUESTIONS: The patient responded to the following health history questions as indicated:    1. Are you having any GI issues? no 2. Do you have a personal history of Polyps? no 3. Do you have a family history of Colon Cancer or Polyps? no 4. Diabetes Mellitus? no 5. Joint replacements in the past 12 months?no 6. Major health problems in the past 3 months?no 7. Any artificial heart valves, MVP, or defibrillator?no    MEDICATIONS & ALLERGIES:    Patient reports the following regarding taking any anticoagulation/antiplatelet therapy:   Plavix, Coumadin, Eliquis, Xarelto, Lovenox, Pradaxa, Brilinta, or Effient? no Aspirin? no  Patient confirms/reports the following medications:  No current outpatient medications on file.   No current facility-administered medications for this visit.    Patient confirms/reports the following allergies:  No Known Allergies  No orders of the defined types were placed in this encounter.   AUTHORIZATION INFORMATION Primary Insurance: 1D#: Group #:  Secondary Insurance: 1D#: Group #:  SCHEDULE INFORMATION: Date: Not Scheduled-plans to check with insurance and work schedule Time: Location:[facility]

## 2020-02-15 ENCOUNTER — Ambulatory Visit
Admission: RE | Admit: 2020-02-15 | Discharge: 2020-02-15 | Disposition: A | Discharge status: Home / Self Care | Payer: Managed Care, Other (non HMO) | Source: Ambulatory Visit | Attending: Obstetrics & Gynecology | Admitting: Obstetrics & Gynecology

## 2020-02-15 ENCOUNTER — Other Ambulatory Visit: Payer: Self-pay

## 2020-02-15 DIAGNOSIS — Z1231 Encounter for screening mammogram for malignant neoplasm of breast: Secondary | ICD-10-CM | POA: Insufficient documentation

## 2020-02-25 ENCOUNTER — Other Ambulatory Visit: Payer: Self-pay | Admitting: Obstetrics & Gynecology

## 2021-01-15 ENCOUNTER — Other Ambulatory Visit: Payer: Self-pay | Admitting: Obstetrics & Gynecology

## 2021-01-15 DIAGNOSIS — Z1231 Encounter for screening mammogram for malignant neoplasm of breast: Secondary | ICD-10-CM

## 2021-01-29 ENCOUNTER — Other Ambulatory Visit: Payer: Self-pay

## 2021-01-29 ENCOUNTER — Ambulatory Visit (INDEPENDENT_AMBULATORY_CARE_PROVIDER_SITE_OTHER): Payer: Managed Care, Other (non HMO) | Admitting: Obstetrics & Gynecology

## 2021-01-29 ENCOUNTER — Encounter: Payer: Self-pay | Admitting: Obstetrics & Gynecology

## 2021-01-29 VITALS — BP 120/80 | Ht 67.0 in | Wt 195.0 lb

## 2021-01-29 DIAGNOSIS — Z01419 Encounter for gynecological examination (general) (routine) without abnormal findings: Secondary | ICD-10-CM

## 2021-01-29 DIAGNOSIS — Z1231 Encounter for screening mammogram for malignant neoplasm of breast: Secondary | ICD-10-CM | POA: Diagnosis not present

## 2021-01-29 NOTE — Progress Notes (Signed)
HPI:      [name] is a 50 y.o. [id number] who LMP was in the past, she presents today for her annual examination.  The patient has no complaints today. The patient is sexually active. Herlast pap: approximate date 2020 and was normal and last mammogram: approximate date 2021 and was normal.  The patient does perform self breast exams.  There is no notable family history of breast or ovarian cancer in her family. The patient is not taking hormone replacement therapy.   The patient has regular exercise: yes. The patient denies current symptoms of depression.    GYN Hx: Last Colonoscopy: not yet done .    PMHx: History reviewed. No pertinent past medical history. Past Surgical History:  Procedure Laterality Date   BREAST CYST ASPIRATION Right 2012   Family History  Problem Relation Age of Onset   Cancer Mother    Hypertension Mother    High Cholesterol Mother    High Cholesterol Father    Hypertension Father    Breast cancer Neg Hx    Social History   Tobacco Use   Smoking status: Never   Smokeless tobacco: Never  Substance Use Topics   Alcohol use: No    Alcohol/week: 0.0 standard drinks   Drug use: No   No current outpatient medications on file. Allergies: Patient has no known allergies.  Review of Systems  Constitutional:  Negative for chills, fever and malaise/fatigue.  HENT:  Negative for congestion, sinus pain and sore throat.   Eyes:  Negative for blurred vision and pain.  Respiratory:  Negative for cough and wheezing.   Cardiovascular:  Negative for chest pain and leg swelling.  Gastrointestinal:  Negative for abdominal pain, constipation, diarrhea, heartburn, nausea and vomiting.  Genitourinary:  Negative for dysuria, frequency, hematuria and urgency.  Musculoskeletal:  Negative for back pain, joint pain, myalgias and neck pain.  Skin:  Negative for itching and rash.  Neurological:  Negative for dizziness, tremors and weakness.   Endo/Heme/Allergies:  Does not bruise/bleed easily.  Psychiatric/Behavioral:  Negative for depression. The patient is not nervous/anxious and does not have insomnia.    Objective: BP 120/80   Ht 5\' 7"  (1.702 m)   Wt 195 lb (88.5 kg)   LMP 01/24/2021   BMI 30.54 kg/m   Filed Weights   01/29/21 1532  Weight: 195 lb (88.5 kg)   Body mass index is 30.54 kg/m. Physical Exam Constitutional:      General: She is not in acute distress.    Appearance: She is well-developed.  Genitourinary:     Bladder, rectum and urethral meatus normal.     No lesions in the vagina.     Right Labia: No rash, tenderness or lesions.    Left Labia: No tenderness, lesions or rash.    No vaginal bleeding.      Right Adnexa: not tender and no mass present.    Left Adnexa: not tender and no mass present.    No cervical motion tenderness, friability, lesion or polyp.     Uterus is not enlarged.     No uterine mass detected.    Pelvic exam was performed with patient in the lithotomy position.  Breasts:    Right: No mass, skin change or tenderness.     Left: No mass, skin change or tenderness.  HENT:     Head: Normocephalic and atraumatic. No laceration.     Right Ear: Hearing normal.     Left Ear:  Hearing normal.     Mouth/Throat:     Pharynx: Uvula midline.  Eyes:     Pupils: Pupils are equal, round, and reactive to light.  Neck:     Thyroid: No thyromegaly.  Cardiovascular:     Rate and Rhythm: Normal rate and regular rhythm.     Heart sounds: No murmur heard.   No friction rub. No gallop.  Pulmonary:     Effort: Pulmonary effort is normal. No respiratory distress.     Breath sounds: Normal breath sounds. No wheezing.  Abdominal:     General: Bowel sounds are normal. There is no distension.     Palpations: Abdomen is soft.     Tenderness: There is no abdominal tenderness. There is no rebound.  Musculoskeletal:        General: Normal range of motion.     Cervical back: Normal range of  motion and neck supple.  Neurological:     Mental Status: She is alert and oriented to person, place, and time.     Cranial Nerves: No cranial nerve deficit.  Skin:    General: Skin is warm and dry.  Psychiatric:        Judgment: Judgment normal.  Vitals reviewed.    Assessment: Annual Exam 1. Women's annual routine gynecological examination   2. Encounter for screening mammogram for malignant neoplasm of breast     Plan:            1.  Cervical Screening-  Pap smear schedule reviewed with patient, 2023  2. Breast screening- Exam annually and mammogram scheduled  3. Colonoscopy every 10 years, Hemoccult testing after age 28  4. Labs managed by PCP High cholesterol d/w pt, info gv on supplements that may help.  She has been on Niacin and Omega3.  DIscuxssed Red Yeast Rice.    F/U  Return in about 1 year (around 01/29/2022) for Annual.  Annamarie Major, MD, [facility], [facility] 01/29/2021  4:05 PM

## 2021-01-29 NOTE — Patient Instructions (Signed)
The above results of your cholesterol testing reveals higher than normal levels.  Recommendations include keeping total cholesterol below 200, LDL below 130 or even 100 when additional risk factor are present such as diabetes or heart disease, and HDL above 50.  Options include over the counter supplements or prescription therapy.    Based on your levels, I recommend we start with adding either Omega-3 (DHA or fish oil) supplement or Red Yeast Rice or Niacin as a natural alternative to reduce high cholesterol levels.  We should recheck levels next year, or sooner (no sooner than 3 months) as desired.  I reserve prescription therapy for severe or refractory cases, and always involve an internal medicine specialist in those cases.    Thank you for choosing [facility]. As part of our ongoing efforts to improve patient experience, we would appreciate your feedback. Please fill out the short survey that you will receive by mail or MyChart. Your opinion is important to [place]! -Dr Tiburcio Pea

## 2021-02-15 ENCOUNTER — Ambulatory Visit
Admission: RE | Admit: 2021-02-15 | Discharge: 2021-02-15 | Disposition: A | Discharge status: Home / Self Care | Payer: Managed Care, Other (non HMO) | Source: Ambulatory Visit | Attending: Obstetrics & Gynecology | Admitting: Obstetrics & Gynecology

## 2021-02-15 ENCOUNTER — Other Ambulatory Visit: Payer: Self-pay

## 2021-02-15 DIAGNOSIS — Z1231 Encounter for screening mammogram for malignant neoplasm of breast: Secondary | ICD-10-CM | POA: Diagnosis not present

## 2021-08-08 ENCOUNTER — Encounter: Payer: Self-pay | Admitting: Obstetrics & Gynecology

## 2021-08-08 ENCOUNTER — Ambulatory Visit (INDEPENDENT_AMBULATORY_CARE_PROVIDER_SITE_OTHER): Payer: Managed Care, Other (non HMO) | Admitting: Obstetrics & Gynecology

## 2021-08-08 VITALS — BP 120/80 | Ht 67.0 in | Wt 188.0 lb

## 2021-08-08 DIAGNOSIS — N921 Excessive and frequent menstruation with irregular cycle: Secondary | ICD-10-CM | POA: Diagnosis not present

## 2021-08-08 NOTE — Progress Notes (Signed)
?  HPI: ?     [name] is a 51 y.o. [id number] who LMP was Patient's last menstrual period was 07/16/2021., presents today for a problem visit.  She complains of metrorrhagia (no pattern) that  began  this past month (prior periods reg and normal)  and its severity is described as moderate.  She has reg periods, last one 3/13-3/17, but then had spotting almost daily and then period like bleed 4/1-4/5 (today) (too early).  No other menopausal sx;s, and periods are associated with mild menstrual cramping.  She has used the following for attempts at control: thin pad. ? ?Previous evaluation: none.  ? ?PMHx: ?She  has no past medical history on file. Also,  has a past surgical history that includes Breast cyst aspiration (Right, 2012)., family history includes Cancer in her mother; High Cholesterol in her father and mother; Hypertension in her father and mother.,  reports that she has never smoked. She has never used smokeless tobacco. She reports that she does not drink alcohol and does not use drugs. ? ?She No current outpatient medications on file.  ?Also, has No Known Allergies. ? ?Review of Systems  ?All other systems reviewed and are negative. ? ?Objective: ?BP 120/80   Ht 5\' 7"  (1.702 m)   Wt 188 lb (85.3 kg)   LMP 07/16/2021   BMI 29.44 kg/m?  ?Physical Exam ?Constitutional:   ?   General: She is not in acute distress. ?   Appearance: She is well-developed.  ?Musculoskeletal:     ?   General: Normal range of motion.  ?Neurological:  ?   Mental Status: She is alert and oriented to person, place, and time.  ?Skin: ?   General: Skin is warm and dry.  ?Vitals reviewed.  ? ? ?ASSESSMENT/PLAN:  dysfunctional uterine bleeding ? ?Problem List Items Addressed This Visit   ?None ?Visit Diagnoses   ? ? Metrorrhagia    -  Primary  ? Relevant Orders  ? 07/18/2021 PELVIC COMPLETE WITH TRANSVAGINAL  ? ?  ?Patient has abnormal uterine bleeding . She has a normal exam today, with no evidence of lesions.   Evaluation includes the following: exam, labs such as hormonal testing, and pelvic ultrasound to evaluate for any structural gynecologic abnormalities.  Patient to follow up after testing. ? ?Treatment option for menorrhagia or menometrorrhagia discussed in great detail with the patient.  Options include hormonal therapy, IUD therapy such as Mirena, D&C, Ablation, and Hysterectomy.  The pros and cons of each option discussed with patient. ? ? ? ?[place], MD, FACOG ?[facility], [facility] ?08/08/2021  11:23 AM ? ?

## 2021-08-16 ENCOUNTER — Ambulatory Visit
Admission: RE | Admit: 2021-08-16 | Discharge: 2021-08-16 | Disposition: A | Discharge status: Home / Self Care | Payer: Managed Care, Other (non HMO) | Source: Ambulatory Visit | Attending: Obstetrics & Gynecology | Admitting: Obstetrics & Gynecology

## 2021-08-16 ENCOUNTER — Other Ambulatory Visit: Payer: Self-pay

## 2021-08-16 DIAGNOSIS — N921 Excessive and frequent menstruation with irregular cycle: Secondary | ICD-10-CM | POA: Diagnosis not present

## 2021-08-17 ENCOUNTER — Encounter: Payer: Self-pay | Admitting: Obstetrics & Gynecology

## 2021-08-20 ENCOUNTER — Encounter: Payer: Self-pay | Admitting: Obstetrics & Gynecology

## 2022-01-31 ENCOUNTER — Encounter: Payer: Self-pay | Admitting: Obstetrics and Gynecology

## 2022-02-04 ENCOUNTER — Other Ambulatory Visit: Payer: Self-pay | Admitting: Physician Assistant

## 2022-02-04 DIAGNOSIS — Z1231 Encounter for screening mammogram for malignant neoplasm of breast: Secondary | ICD-10-CM

## 2022-02-23 IMAGING — MG MM DIGITAL SCREENING BILAT W/ TOMO AND CAD
8 series · 8 of 24 positions shown · non-contrast
Comparison: Previous exam(s).

CLINICAL DATA: Screening.

EXAM:
DIGITAL SCREENING BILATERAL MAMMOGRAM WITH TOMOSYNTHESIS AND CAD
TECHNIQUE: Bilateral screening digital craniocaudal and mediolateral oblique
mammograms were obtained. Bilateral screening digital breast
tomosynthesis was performed. The images were evaluated with
computer-aided detection.

[L CC synth-2D]
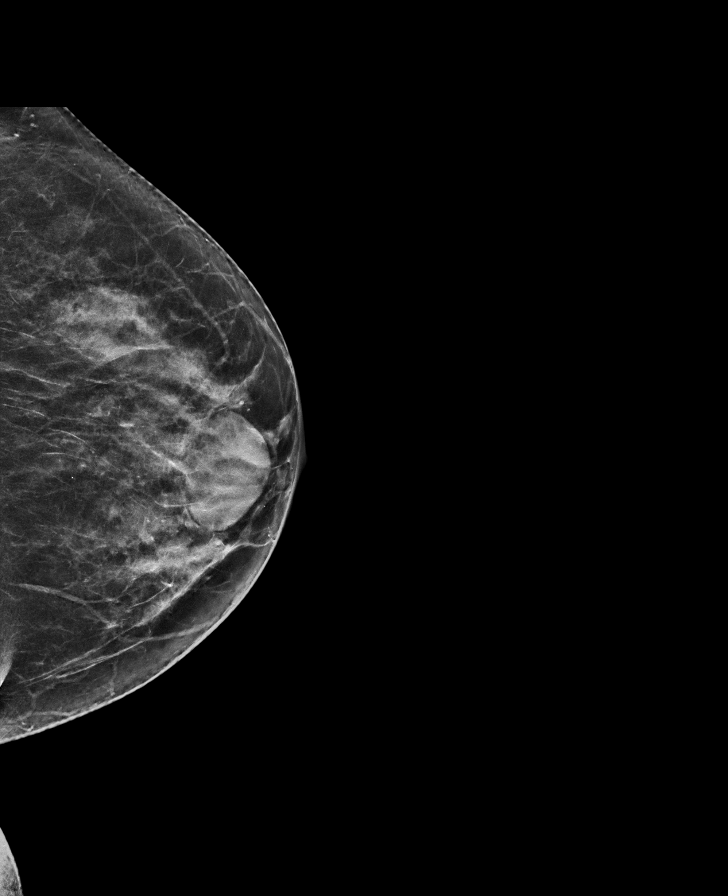

[R CC synth-2D]
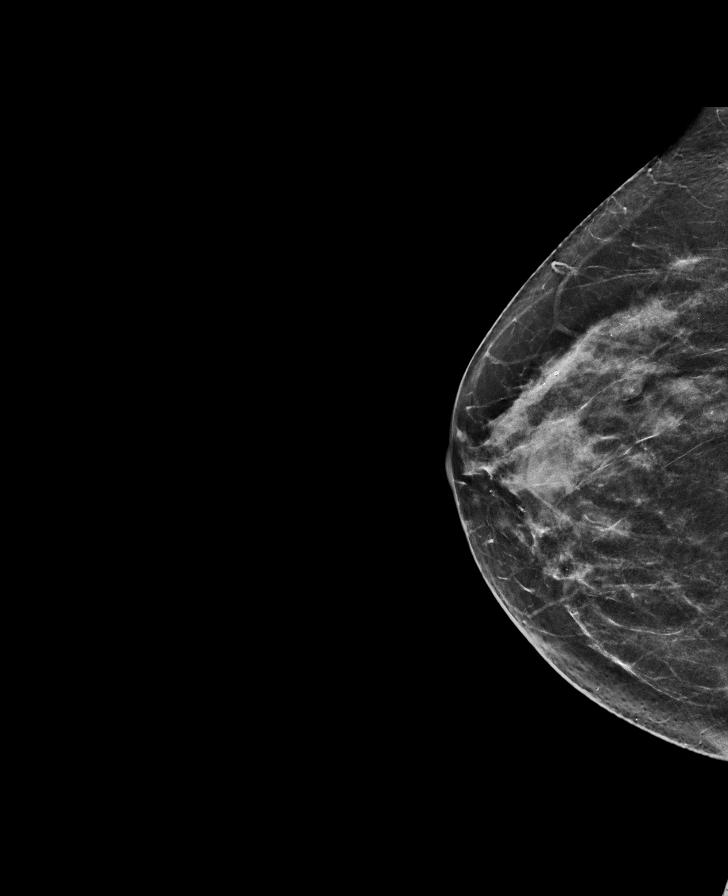

[R MLO synth-2D]
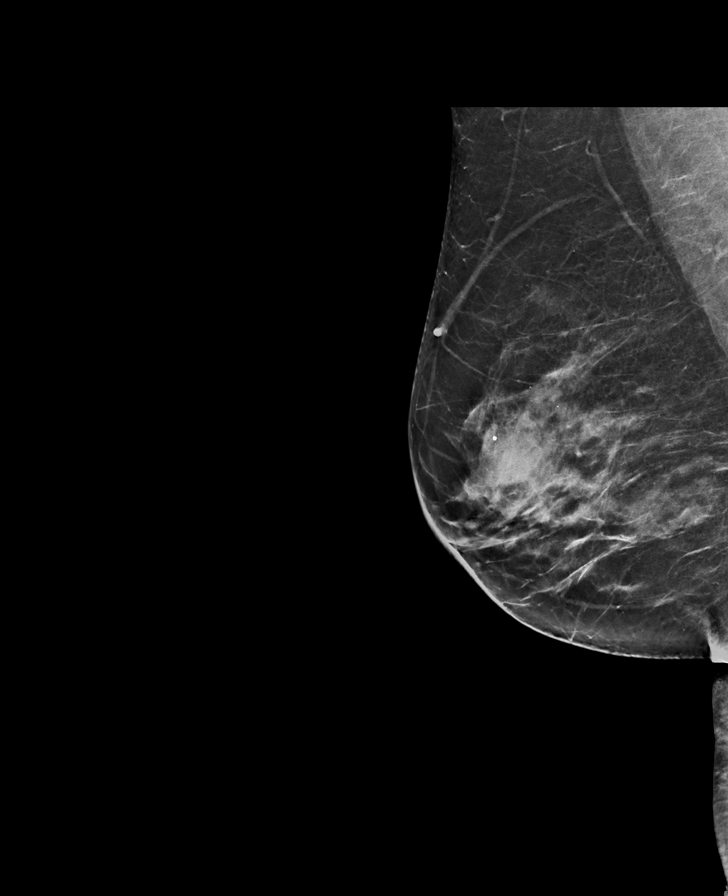

[L MLO synth-2D]
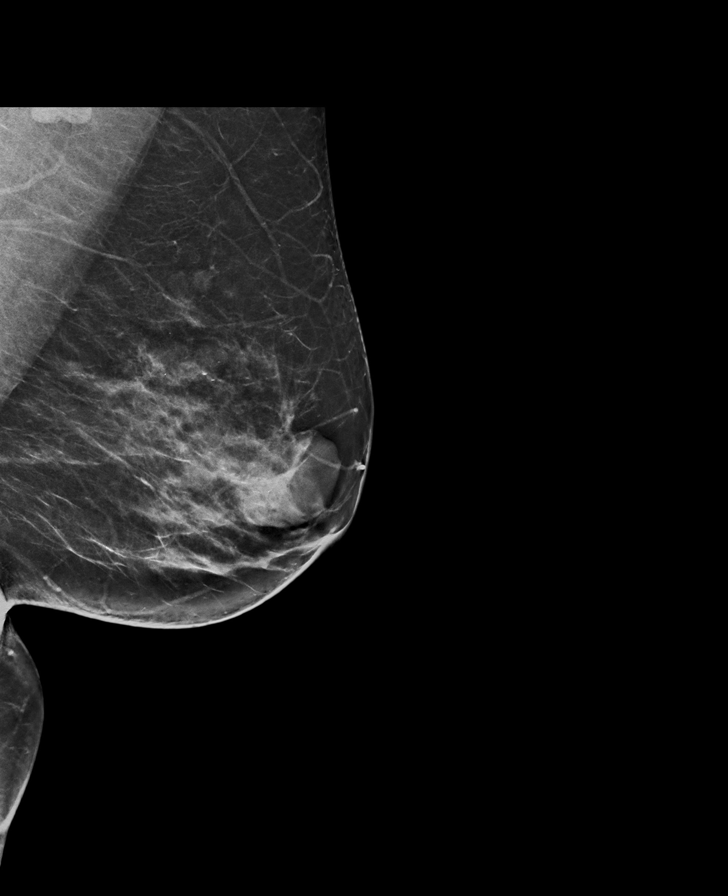

[R CC tomo · tomo slice 32/63.0]
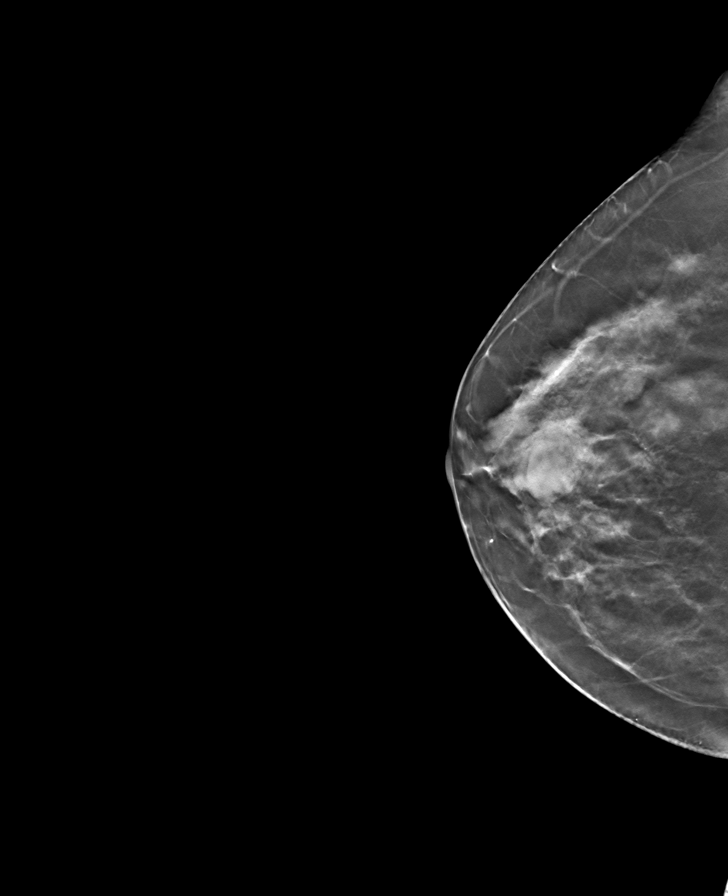

[L CC tomo · tomo slice 33/66.0]
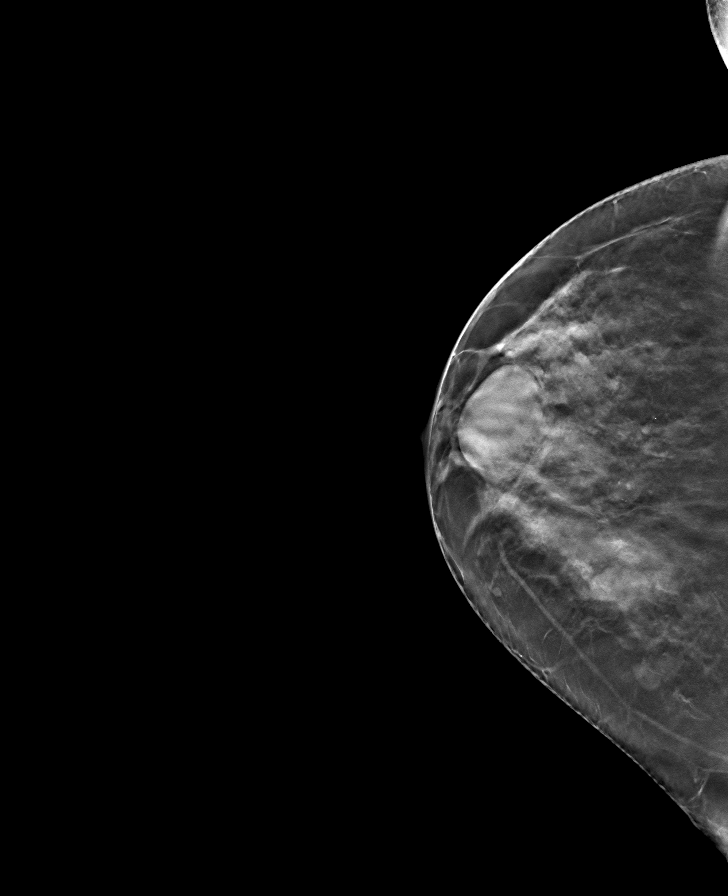

[R MLO tomo · tomo slice 37/72.0]
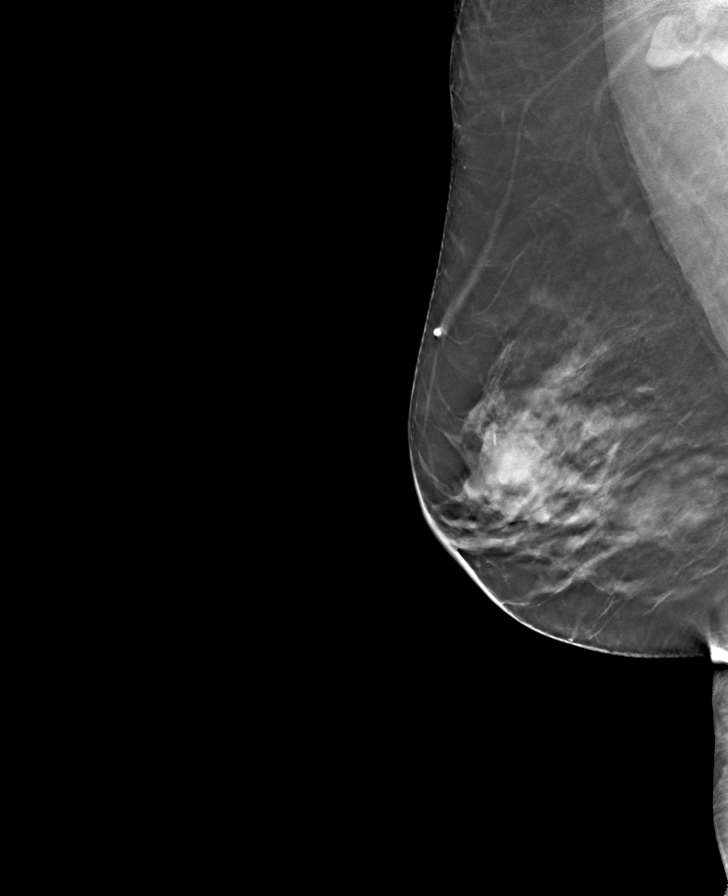

[L MLO tomo · tomo slice 37/73.0]
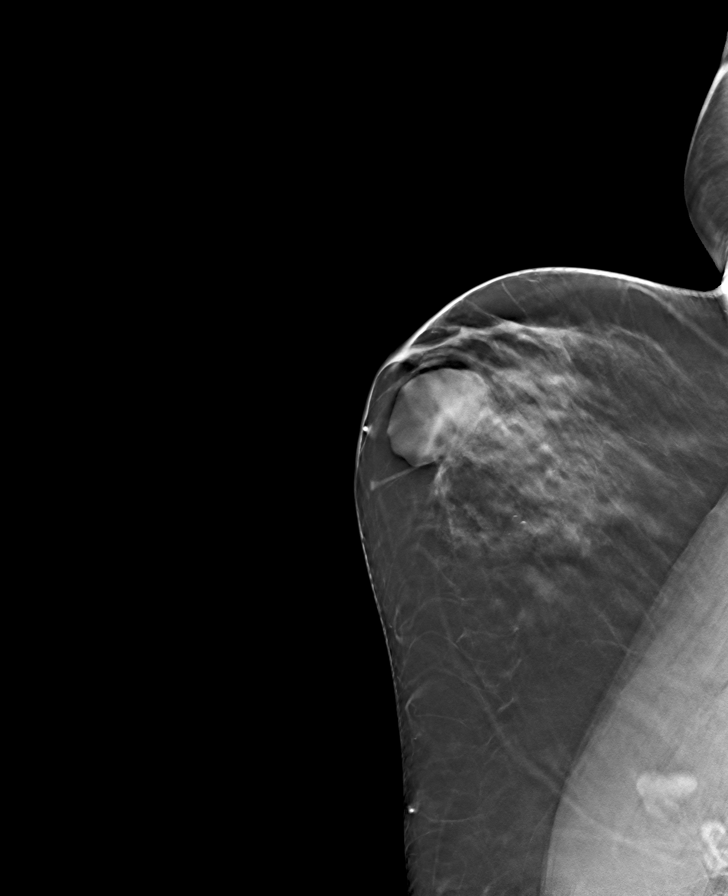

[8 of 24 positions shown; findings below may reference images not displayed]

ACR Breast Density Category c: The breast tissue is heterogeneously
dense, which may obscure small masses.
FINDINGS: There are no findings suspicious for malignancy.
IMPRESSION: No mammographic evidence of malignancy. A result letter of this
screening mammogram will be mailed directly to the patient.

RECOMMENDATION:
Screening mammogram in one year. (Code:Q3-W-BC3)

BI-RADS CATEGORY  1: Negative.

## 2022-02-28 ENCOUNTER — Ambulatory Visit
Admission: RE | Admit: 2022-02-28 | Discharge: 2022-02-28 | Disposition: A | Discharge status: Home / Self Care | Payer: Managed Care, Other (non HMO) | Source: Ambulatory Visit | Attending: Physician Assistant | Admitting: Physician Assistant

## 2022-02-28 DIAGNOSIS — Z1231 Encounter for screening mammogram for malignant neoplasm of breast: Secondary | ICD-10-CM | POA: Insufficient documentation

## 2022-08-24 IMAGING — US US PELVIS COMPLETE WITH TRANSVAGINAL
1 series · 13 of 25 positions shown · non-contrast
Comparison: Prior ultrasound from 09/11/2011.

CLINICAL DATA: Initial evaluation for metrorrhagia. History of
Essure contraceptive device placement.

EXAM:
TRANSABDOMINAL AND TRANSVAGINAL ULTRASOUND OF PELVIS
TECHNIQUE: Both transabdominal and transvaginal ultrasound examinations of the
pelvis were performed. Transabdominal technique was performed for
global imaging of the pelvis including uterus, ovaries, adnexal
regions, and pelvic cul-de-sac. It was necessary to proceed with
endovaginal exam following the transabdominal exam to visualize the
endometrium and ovaries.

[Series 1: us pelvis complete with transvaginal · 0.20mm/px · 107 acquisitions, 13 frames shown]
[im 1/107]
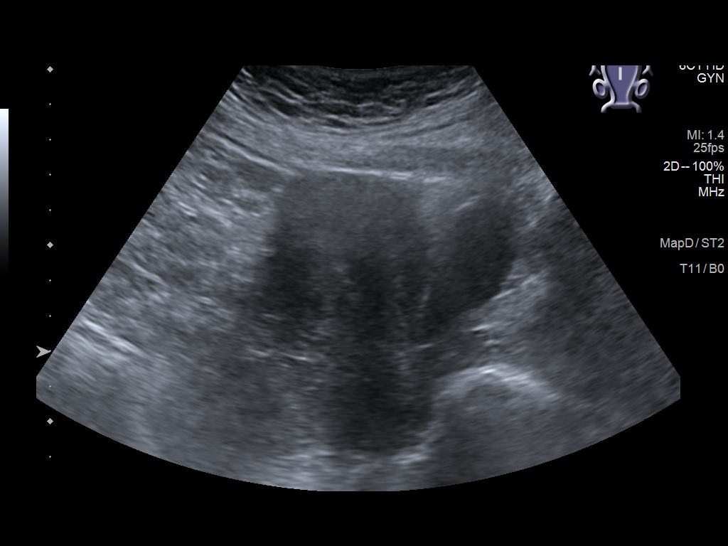
[im 9/107]
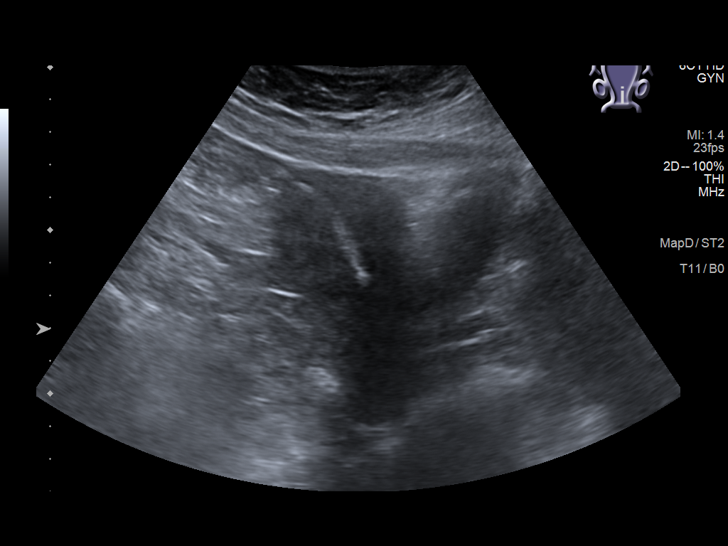
[im 18/107]
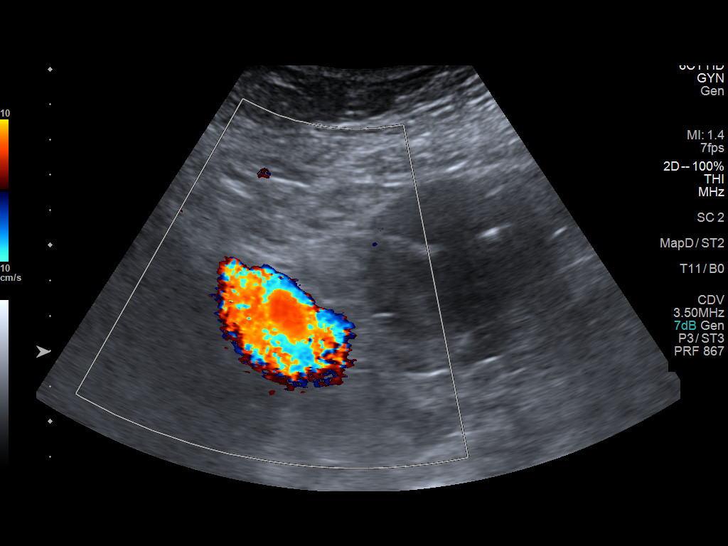
[im 27/107]
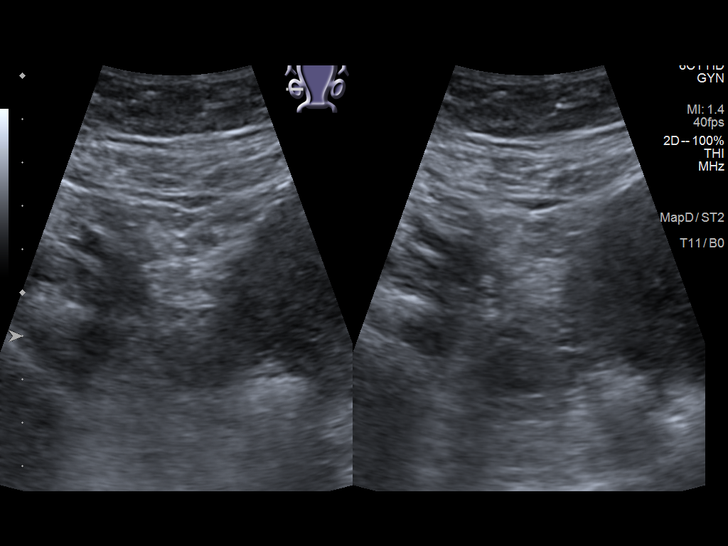
[im 36/107]
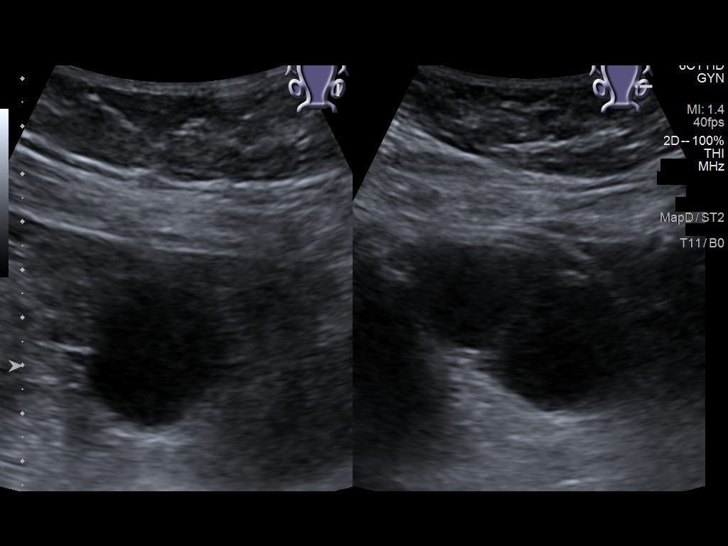
[im 45/107]
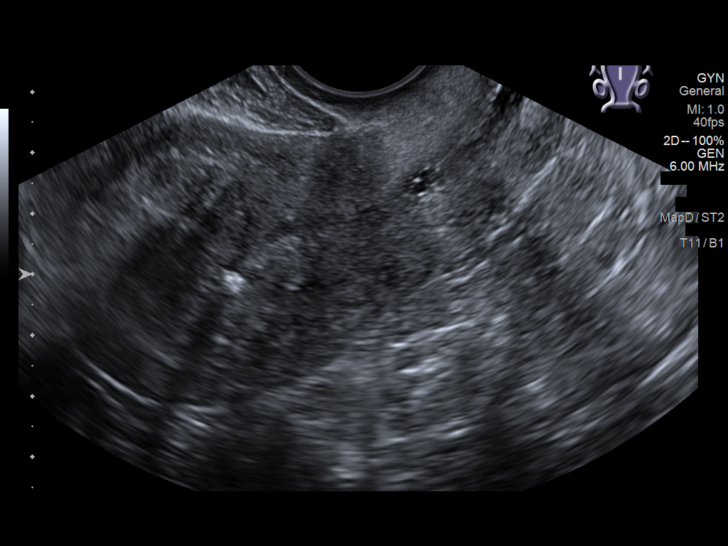
[im 54/107]
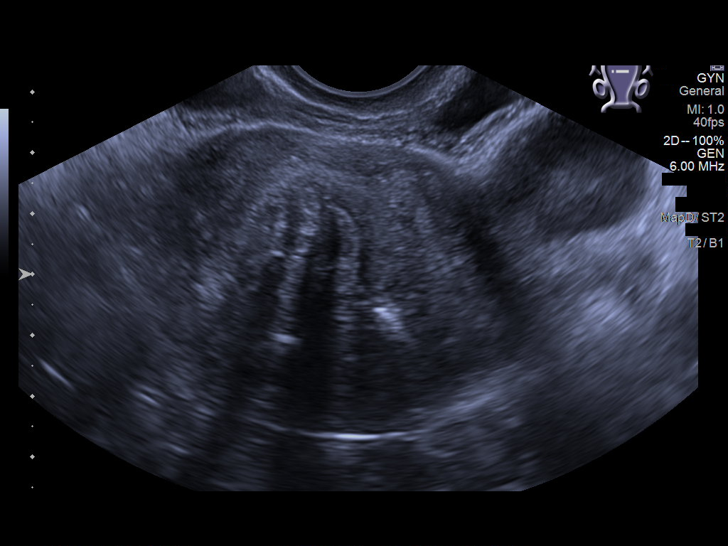
[im 62/107]
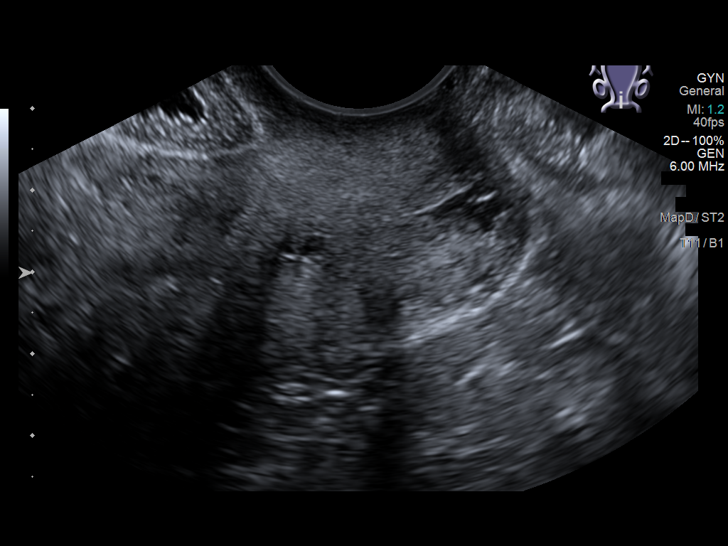
[im 71/107]
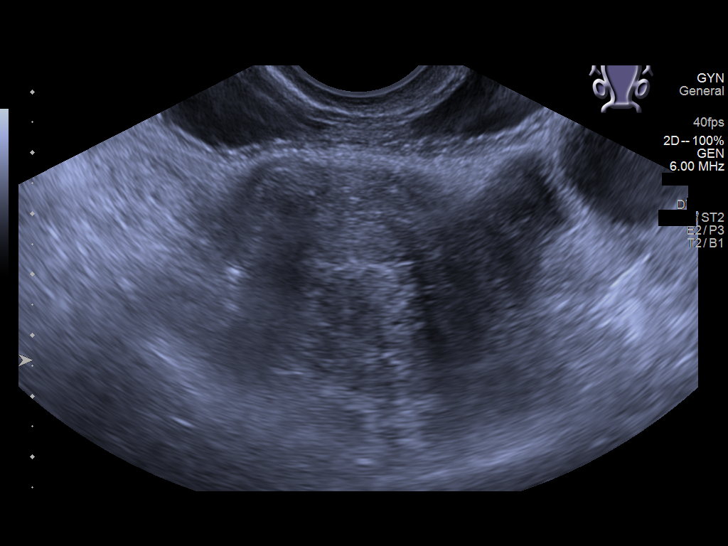
[im 80/107]
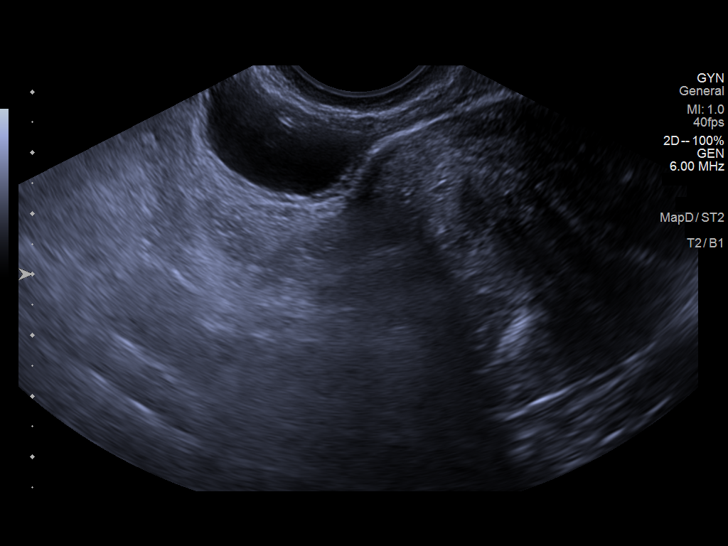
[im 89/107]
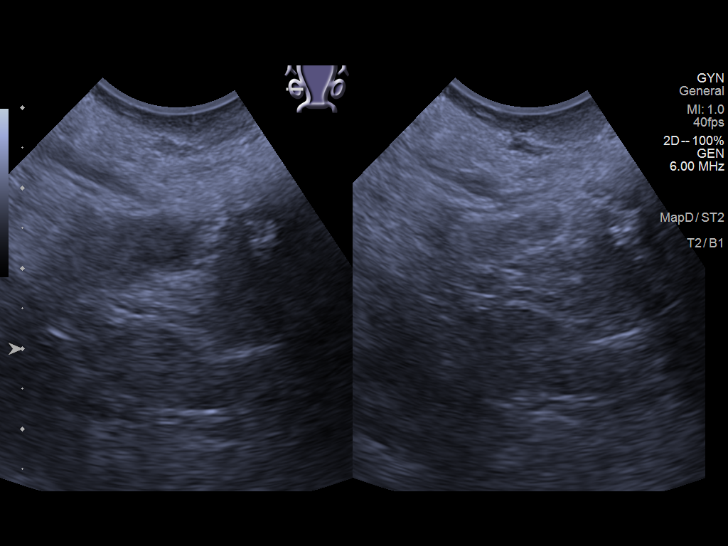
[im 98/107]
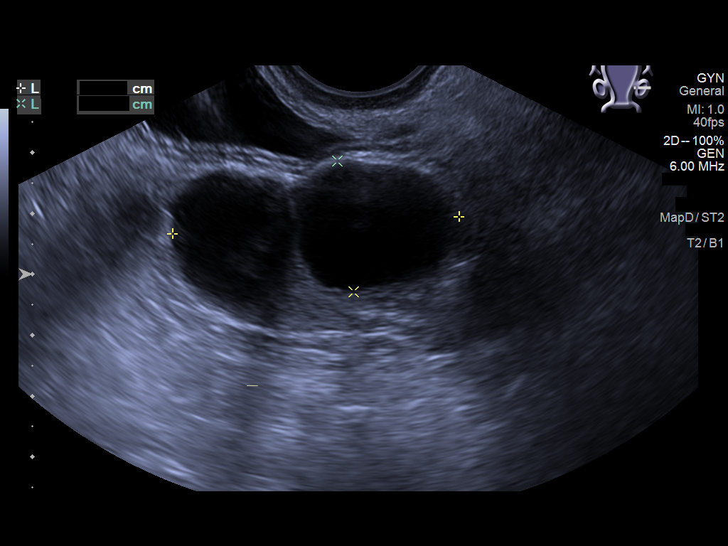
[im 107/107]
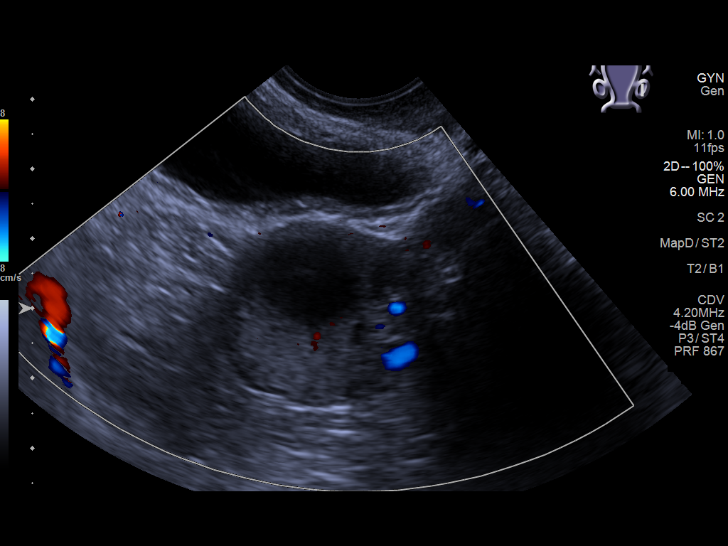

[13 of 25 positions shown; findings below may reference images not displayed]

FINDINGS: Uterus

Measurements: 8.6 x 4.9 x 5.8 cm = volume: 128.3 mL. Uterus is
anteverted. 2.5 x 2.5 x 2.3 cm intramural to submucosal fibroid
present at the left mid uterine body. 1.4 x 1.5 x 1.2 cm subserosal
fibroid present at the right uterine fundus.

Endometrium

Thickness: 5.2 mm. No focal abnormality visualized. Linear echogenic
foci consistent with patient's known Essure contraceptive device.

Right ovary

Measurements: 1.7 x 0.9 x 1.4 cm = volume: 1.0 mL. Normal
appearance/no adnexal mass.

Left ovary

Measurements: 3.3 x 2.6 x 2.7 cm = volume: 20.9 mL. There is a
complex cyst measuring 4.7 x 2.2 x 2.9 cm. Lesion demonstrates a
single thin internal septation. Additional scattered low-level
echoes seen within a portion of this lesion. No definite internal
vascularity or solid nodularity.

Other findings

No abnormal free fluid.
IMPRESSION: 1. Endometrial stripe within normal limits measuring 5.2 mm in
thickness. If bleeding remains unresponsive to hormonal or medical
therapy, sonohysterogram should be considered for focal lesion
work-up. (Ref: Radiological Reasoning: Algorithmic Workup of
Abnormal Vaginal Bleeding with Endovaginal Sonography and
Sonohysterography. AJR 1335; 191:S68-73).
2. Underlying fibroid uterus as above. Dominant 2.5 cm intramural
fibroid at the central uterus likely has a submucosal component.
3. 4.7 cm complex and septated left ovarian cyst, indeterminate.
Short interval follow-up ultrasound in 6-12 weeks recommended for
further evaluation.
4. Normal right ovary.  No other adnexal mass or free fluid.
5. Essure contraceptive devices in place.

## 2023-01-23 ENCOUNTER — Other Ambulatory Visit: Payer: Self-pay | Admitting: Physician Assistant

## 2023-01-23 DIAGNOSIS — Z1231 Encounter for screening mammogram for malignant neoplasm of breast: Secondary | ICD-10-CM

## 2023-03-04 ENCOUNTER — Ambulatory Visit
Admission: RE | Admit: 2023-03-04 | Discharge: 2023-03-04 | Disposition: A | Discharge status: Home / Self Care | Payer: Managed Care, Other (non HMO) | Source: Ambulatory Visit | Attending: Physician Assistant | Admitting: Physician Assistant

## 2023-03-04 DIAGNOSIS — Z1231 Encounter for screening mammogram for malignant neoplasm of breast: Secondary | ICD-10-CM | POA: Insufficient documentation
